# Patient Record
Sex: Male | Born: 2005 | Race: Black or African American | Hispanic: No | Marital: Single | Smoking: Never smoker
Health system: Southern US, Community
[De-identification: ages and names within clinical notes are randomized; demographics above are authoritative.]

## PROBLEM LIST (undated history)

## (undated) DIAGNOSIS — J45909 Unspecified asthma, uncomplicated: Secondary | ICD-10-CM

---
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
## 2024-06-25 ENCOUNTER — Ambulatory Visit
Admission: EM | Admit: 2024-06-25 | Discharge: 2024-06-25 | Disposition: A | Discharge status: Home / Self Care | Attending: Nurse Practitioner | Admitting: Nurse Practitioner

## 2024-06-25 ENCOUNTER — Encounter: Payer: Self-pay | Admitting: Emergency Medicine

## 2024-06-25 DIAGNOSIS — R091 Pleurisy: Secondary | ICD-10-CM | POA: Diagnosis not present

## 2024-06-25 MED ORDER — NAPROXEN 500 MG PO TABS: 500.0000 mg | ORAL_TABLET | Freq: Two times a day (BID) | ORAL | 0 refills | Status: AC

## 2024-06-25 MED ORDER — PREDNISONE 50 MG PO TABS
60.0000 mg | ORAL_TABLET | Freq: Once | ORAL | Status: AC
  Administered 2024-06-25: 60 mg via ORAL

## 2024-06-25 NOTE — ED Triage Notes (Signed)
 Pt reports he was working last night and started to have sharp pains near his heart. Pt says he went home to get some rest but when he woke up the pain was still present and has lasted all day today. Pt denies SOB but does c/o the chest pain worsening when taking deep breaths.

## 2024-06-25 NOTE — Discharge Instructions (Addendum)
 You were seen today for chest pain on the left side that started last night while you were at work. Your exam and heart testing were normal, and the pain appears to be from inflammation of the lining around the lungs, a condition called pleurisy. This type of pain often feels worse when you take a deep breath and usually improves over time with treatment and rest. You were given a dose of prednisone  in the clinic and prescribed naproxen  to take twice a day with food. Do not take other over-the-counter anti-inflammatory medicines such as ibuprofen , Advil , Motrin , or Aleve  while you are on naproxen . If you need additional pain relief, you may take Tylenol as directed. To help with symptoms at home, use warm compresses on the sore area, drink plenty of fluids, avoid smoking, and try to rest while avoiding activities that make the pain worse. You should follow up with your primary care provider in one to two weeks to make sure you are improving. Go to the emergency department right away if your chest pain suddenly gets worse, if it no longer seems connected to your breathing, or if you develop shortness of breath, dizziness, or any other concerning symptoms.

## 2024-06-25 NOTE — ED Provider Notes (Signed)
 [facility]    CSN: [id number] Arrival date & time: 06/25/24  1702      History   Chief Complaint Chief Complaint  Patient presents with   Chest Pain    HPI Dan Schmitt is a 18 y.o. male.   Discussed the use of AI scribe software for clinical note transcription with the patient, who gave verbal consent to proceed.   The patient presents with chest pain that started last night while doing regular work activities. The pain is located underneath the left breast area in the left chest and is described as sharp in character. The patient reports the pain is worse with coughing and taking deep breaths. The pain remains in the same location and does not radiate to the back, neck, or shoulder. The pain does not change with position - it remains the same whether lying down or sitting up/leaning forward. The patient denies shortness of breath, sweating, palpitations, swelling in feet/legs/ankles, nausea, vomiting, dizziness, or headache. The patient has not experienced this type of pain before and has not taken anything for it. The patient denies recent heavy lifting or activities outside of normal routine. The patient denies any recent URI. The patient denies smoking cigarettes or vaping but does smoke marijuana regularly.   The following sections of the patient's history were reviewed and updated as appropriate: allergies, current medications, past family history, past medical history, past social history, past surgical history, and problem list.       Past Medical History:  Diagnosis Date   Asthma     There are no active problems to display for this patient.   History reviewed. No pertinent surgical history.     Home Medications    Prior to Admission medications   Medication Sig Start Date End Date Taking? Authorizing Provider  naproxen  (NAPROSYN ) 500 MG tablet Take 1 tablet (500 mg total) by mouth 2 (two) times daily with a meal. Take with food to avoid stomach  upset. Do not take any additional NSAIDs while on this. You may take tylenol in addition to this if needed for extra pain relief. 06/25/24  Yes Jevaeh Shams, FNP  albuterol (PROVENTIL HFA;VENTOLIN HFA) 108 (90 BASE) MCG/ACT inhaler Inhale into the lungs every 6 (six) hours as needed for wheezing or shortness of breath.    [provider]  clindamycin  (CLEOCIN ) 300 MG capsule Take 1 capsule (300 mg total) by mouth 3 (three) times daily. 11/13/23   Raspet, Erin K, PA-C  ibuprofen  (ADVIL ) 600 MG tablet Take 1 tablet (600 mg total) by mouth every 8 (eight) hours as needed. 11/13/23   Raspet, Erin K, PA-C  montelukast (SINGULAIR) 10 MG tablet Take 10 mg by mouth at bedtime.    [provider]  mupirocin  ointment (BACTROBAN ) 2 % Apply 1 Application topically daily. 11/13/23   Raspet, Rocky POUR, PA-C    Family History History reviewed. No pertinent family history.  Social History Social History   Tobacco Use   Smoking status: Never    Passive exposure: Never   Smokeless tobacco: Never  Vaping Use   Vaping status: Never Used  Substance Use Topics   Alcohol use: Never   Drug use: Yes    Types: Marijuana     Allergies   Augmentin [amoxicillin-pot clavulanate] and Sulfa antibiotics   Review of Systems Review of Systems  Constitutional:  Negative for diaphoresis.  Eyes:  Negative for visual disturbance.  Respiratory:  Negative for shortness of breath.   Cardiovascular:  Positive for chest pain. Negative for palpitations and leg swelling.  Gastrointestinal:  Negative for nausea and vomiting.  Neurological:  Negative for dizziness and headaches.  All other systems reviewed and are negative.    Physical Exam Triage Vital Signs ED Triage Vitals  Encounter Vitals Group     BP 06/25/24 1743 (!) 147/83     Girls Systolic BP Percentile --      Girls Diastolic BP Percentile --      Boys Systolic BP Percentile --      Boys Diastolic BP Percentile --      Pulse Rate  06/25/24 1743 78     Resp 06/25/24 1743 20     Temp 06/25/24 1743 98.4 F (36.9 C)     Temp Source 06/25/24 1743 Oral     SpO2 06/25/24 1743 98 %     Weight 06/25/24 1742 270 lb 1 oz (122.5 kg)     Height --      Head Circumference --      Peak Flow --      Pain Score 06/25/24 1741 8     Pain Loc --      Pain Education --      Exclude from Growth Chart --    No data found.  Updated Vital Signs BP (!) 147/83 (BP Location: Left Arm)   Pulse 78   Temp 98.4 F (36.9 C) (Oral)   Resp 20   Wt 270 lb 1 oz (122.5 kg)   SpO2 98%   BMI 36.63 kg/m   Visual Acuity Right Eye Distance:   Left Eye Distance:   Bilateral Distance:    Right Eye Near:   Left Eye Near:    Bilateral Near:     Physical Exam Vitals reviewed.  Constitutional:      General: He is awake. He is not in acute distress.    Appearance: Normal appearance. He is well-developed. He is not ill-appearing, toxic-appearing or diaphoretic.  HENT:     Head: Normocephalic.     Right Ear: Hearing normal.     Left Ear: Hearing normal.     Nose: Nose normal.     Mouth/Throat:     Mouth: Mucous membranes are moist.  Eyes:     General: Vision grossly intact.     Conjunctiva/sclera: Conjunctivae normal.  Cardiovascular:     Rate and Rhythm: Normal rate and regular rhythm.     Pulses: Normal pulses.     Heart sounds: Normal heart sounds.  Pulmonary:     Effort: Pulmonary effort is normal. No respiratory distress.     Breath sounds: Normal breath sounds and air entry.     Comments: Respirations even and unlabored. Chest:     Chest wall: No tenderness.  Musculoskeletal:        General: Normal range of motion.     Cervical back: Normal range of motion and neck supple.     Right lower leg: No edema.     Left lower leg: No edema.  Skin:    General: Skin is warm and dry.  Neurological:     General: No focal deficit present.     Mental Status: He is alert and oriented to person, place, and time.  Psychiatric:         Speech: Speech normal.        Behavior: Behavior is cooperative.      UC Treatments / Results  Labs (all labs ordered are listed, but only  abnormal results are displayed) Labs Reviewed - No data to display  EKG   Radiology No results found.  Procedures ED EKG  Date/Time: 06/25/2024 6:32 PM  Performed by: Iola Lukes, FNP Authorized by: Iola Lukes, FNP   Rate:    ECG rate:  74   ECG rate assessment: normal   Rhythm:    Rhythm: sinus rhythm   Ectopy:    Ectopy: none   QRS:    QRS axis:  Normal   QRS intervals:  Normal   QRS conduction: normal   ST segments:    ST segments:  Normal T waves:    T waves: normal   Q waves:    Abnormal Q-waves: not present    (including critical care time)  Medications Ordered in UC Medications  predniSONE  (DELTASONE ) tablet 60 mg (60 mg Oral Given 06/25/24 1904)    Initial Impression / Assessment and Plan / UC Course  I have reviewed the triage vital signs and the nursing notes.  Pertinent labs & imaging results that were available during my care of the patient were reviewed by me and considered in my medical decision making (see chart for details).     The patient presents with left-sided chest pain that began last night while performing routine work activities. The pain is localized under the left breast, worsens with deep breaths, and is not associated with shortness of breath, diaphoresis, palpitations, or other concerning symptoms. EKG was normal, and the presentation is most consistent with pleurisy. In clinic, the patient received prednisone  60 mg orally and was prescribed naproxen  to be taken twice daily with food, with instructions to avoid additional over-the-counter NSAIDs but Tylenol may be used if further pain relief is needed. Supportive measures including warm compresses, increased fluid intake, activity modification, and smoking cessation were recommended. The patient was advised to follow up with their  primary care provider in one to two weeks to ensure gradual improvement and to seek emergency care immediately if the pain worsens, becomes unrelated to breathing, or if new shortness of breath develops.  Today's evaluation has revealed no signs of a dangerous process. Discussed diagnosis with patient and/or guardian. Patient and/or guardian aware of their diagnosis, possible red flag symptoms to watch out for and need for close follow up. Patient and/or guardian understands verbal and written discharge instructions. Patient and/or guardian comfortable with plan and disposition.  Patient and/or guardian has a clear mental status at this time, good insight into illness (after discussion and teaching) and has clear judgment to make decisions regarding their care  Documentation was completed with the aid of voice recognition software. Transcription may contain typographical errors.  Final Clinical Impressions(s) / UC Diagnoses   Final diagnoses:  Pleurisy     Discharge Instructions      You were seen today for chest pain on the left side that started last night while you were at work. Your exam and heart testing were normal, and the pain appears to be from inflammation of the lining around the lungs, a condition called pleurisy. This type of pain often feels worse when you take a deep breath and usually improves over time with treatment and rest. You were given a dose of prednisone  in the clinic and prescribed naproxen  to take twice a day with food. Do not take other over-the-counter anti-inflammatory medicines such as ibuprofen , Advil , Motrin , or Aleve  while you are on naproxen . If you need additional pain relief, you may take Tylenol as directed. To help  with symptoms at home, use warm compresses on the sore area, drink plenty of fluids, avoid smoking, and try to rest while avoiding activities that make the pain worse. You should follow up with your primary care provider in one to two weeks to make  sure you are improving. Go to the emergency department right away if your chest pain suddenly gets worse, if it no longer seems connected to your breathing, or if you develop shortness of breath, dizziness, or any other concerning symptoms.      ED Prescriptions     Medication Sig Dispense Auth. Provider   naproxen  (NAPROSYN ) 500 MG tablet Take 1 tablet (500 mg total) by mouth 2 (two) times daily with a meal. Take with food to avoid stomach upset. Do not take any additional NSAIDs while on this. You may take tylenol in addition to this if needed for extra pain relief. 20 tablet Iola Lukes, FNP      PDMP not reviewed this encounter.   Iola Lukes, [place] 06/25/24 1910
